# Patient Record
Sex: Female | Born: 1955 | Race: White | Hispanic: No | Marital: Married | Smoking: Never smoker
Health system: Southern US, Community
[De-identification: ages and names within clinical notes are randomized; demographics above are authoritative.]

## PROBLEM LIST (undated history)

## (undated) DIAGNOSIS — E785 Hyperlipidemia, unspecified: Secondary | ICD-10-CM

## (undated) HISTORY — DX: Hyperlipidemia, unspecified: E78.5

## (undated) HISTORY — PX: APPENDECTOMY: SHX54

## (undated) HISTORY — PX: BREAST EXCISIONAL BIOPSY: SUR124

---

## 2000-08-13 ENCOUNTER — Encounter: Admission: RE | Admit: 2000-08-13 | Discharge: 2000-08-13 | Payer: Self-pay | Admitting: *Deleted

## 2000-08-13 ENCOUNTER — Encounter: Payer: Self-pay | Admitting: *Deleted

## 2000-10-20 ENCOUNTER — Other Ambulatory Visit: Admission: RE | Admit: 2000-10-20 | Discharge: 2000-10-20 | Payer: Self-pay | Admitting: Family Medicine

## 2003-05-26 ENCOUNTER — Encounter: Payer: Self-pay | Admitting: *Deleted

## 2003-05-26 ENCOUNTER — Encounter: Admission: RE | Admit: 2003-05-26 | Discharge: 2003-05-26 | Payer: Self-pay | Admitting: *Deleted

## 2003-07-21 ENCOUNTER — Other Ambulatory Visit: Admission: RE | Admit: 2003-07-21 | Discharge: 2003-07-21 | Payer: Self-pay | Admitting: *Deleted

## 2004-01-31 ENCOUNTER — Other Ambulatory Visit: Admission: RE | Admit: 2004-01-31 | Discharge: 2004-01-31 | Payer: Self-pay | Admitting: *Deleted

## 2004-07-24 ENCOUNTER — Other Ambulatory Visit: Admission: RE | Admit: 2004-07-24 | Discharge: 2004-07-24 | Payer: Self-pay | Admitting: Family Medicine

## 2004-09-04 ENCOUNTER — Encounter: Admission: RE | Admit: 2004-09-04 | Discharge: 2004-09-04 | Payer: Self-pay | Admitting: Family Medicine

## 2005-07-25 ENCOUNTER — Other Ambulatory Visit: Admission: RE | Admit: 2005-07-25 | Discharge: 2005-07-25 | Payer: Self-pay | Admitting: Family Medicine

## 2005-09-05 ENCOUNTER — Encounter: Admission: RE | Admit: 2005-09-05 | Discharge: 2005-09-05 | Payer: Self-pay | Admitting: Family Medicine

## 2006-09-08 ENCOUNTER — Encounter: Admission: RE | Admit: 2006-09-08 | Discharge: 2006-09-08 | Payer: Self-pay | Admitting: Family Medicine

## 2006-09-22 ENCOUNTER — Encounter: Admission: RE | Admit: 2006-09-22 | Discharge: 2006-09-22 | Payer: Self-pay | Admitting: Family Medicine

## 2007-01-26 ENCOUNTER — Other Ambulatory Visit: Admission: RE | Admit: 2007-01-26 | Discharge: 2007-01-26 | Payer: Self-pay | Admitting: Family Medicine

## 2007-01-30 ENCOUNTER — Encounter: Admission: RE | Admit: 2007-01-30 | Discharge: 2007-01-30 | Payer: Self-pay | Admitting: Family Medicine

## 2007-09-10 ENCOUNTER — Encounter: Admission: RE | Admit: 2007-09-10 | Discharge: 2007-09-10 | Payer: Self-pay | Admitting: Family Medicine

## 2008-02-23 ENCOUNTER — Other Ambulatory Visit: Admission: RE | Admit: 2008-02-23 | Discharge: 2008-02-23 | Payer: Self-pay | Admitting: Family Medicine

## 2008-09-02 ENCOUNTER — Other Ambulatory Visit: Admission: RE | Admit: 2008-09-02 | Discharge: 2008-09-02 | Payer: Self-pay | Admitting: Family Medicine

## 2008-09-16 ENCOUNTER — Encounter: Admission: RE | Admit: 2008-09-16 | Discharge: 2008-09-16 | Payer: Self-pay | Admitting: Family Medicine

## 2009-03-17 ENCOUNTER — Other Ambulatory Visit: Admission: RE | Admit: 2009-03-17 | Discharge: 2009-03-17 | Payer: Self-pay | Admitting: Family Medicine

## 2009-03-23 ENCOUNTER — Encounter: Admission: RE | Admit: 2009-03-23 | Discharge: 2009-03-23 | Payer: Self-pay | Admitting: Family Medicine

## 2009-10-30 ENCOUNTER — Encounter: Admission: RE | Admit: 2009-10-30 | Discharge: 2009-10-30 | Payer: Self-pay | Admitting: Family Medicine

## 2010-06-13 ENCOUNTER — Other Ambulatory Visit: Admission: RE | Admit: 2010-06-13 | Discharge: 2010-06-13 | Payer: Self-pay | Admitting: Family Medicine

## 2010-09-01 ENCOUNTER — Encounter: Payer: Self-pay | Admitting: Family Medicine

## 2010-09-02 ENCOUNTER — Encounter: Payer: Self-pay | Admitting: Family Medicine

## 2011-06-19 ENCOUNTER — Other Ambulatory Visit (HOSPITAL_COMMUNITY)
Admission: RE | Admit: 2011-06-19 | Discharge: 2011-06-19 | Disposition: A | Discharge status: Home / Self Care | Payer: No Typology Code available for payment source | Source: Ambulatory Visit | Attending: Family Medicine | Admitting: Family Medicine

## 2011-06-19 ENCOUNTER — Other Ambulatory Visit: Payer: Self-pay | Admitting: Family Medicine

## 2011-06-19 DIAGNOSIS — Z124 Encounter for screening for malignant neoplasm of cervix: Secondary | ICD-10-CM | POA: Insufficient documentation

## 2011-06-19 DIAGNOSIS — Z1159 Encounter for screening for other viral diseases: Secondary | ICD-10-CM | POA: Insufficient documentation

## 2011-10-24 ENCOUNTER — Other Ambulatory Visit: Payer: Self-pay | Admitting: Family Medicine

## 2011-10-24 DIAGNOSIS — N938 Other specified abnormal uterine and vaginal bleeding: Secondary | ICD-10-CM

## 2011-10-30 ENCOUNTER — Ambulatory Visit
Admission: RE | Admit: 2011-10-30 | Discharge: 2011-10-30 | Disposition: A | Discharge status: Home / Self Care | Payer: BC Managed Care – PPO | Source: Ambulatory Visit | Attending: Family Medicine | Admitting: Family Medicine

## 2011-10-30 DIAGNOSIS — N938 Other specified abnormal uterine and vaginal bleeding: Secondary | ICD-10-CM

## 2011-10-30 IMAGING — US US PELVIS COMPLETE
1 series · 14 of 25 positions shown · non-contrast
Comparison: None.

CLINICAL DATA: Dysfunctional uterine bleeding.  The patient is 55
years old.

TRANSABDOMINAL AND TRANSVAGINAL ULTRASOUND OF PELVIS
TECHNIQUE: Both transabdominal and transvaginal ultrasound
examinations of the pelvis were performed. Transabdominal technique
was performed for global imaging of the pelvis including uterus,
ovaries, adnexal regions, and pelvic cul-de-sac.

[Series 1: us pelvis complete · 0.20mm/px · 14 of 67 slices shown]
[im 1/67]
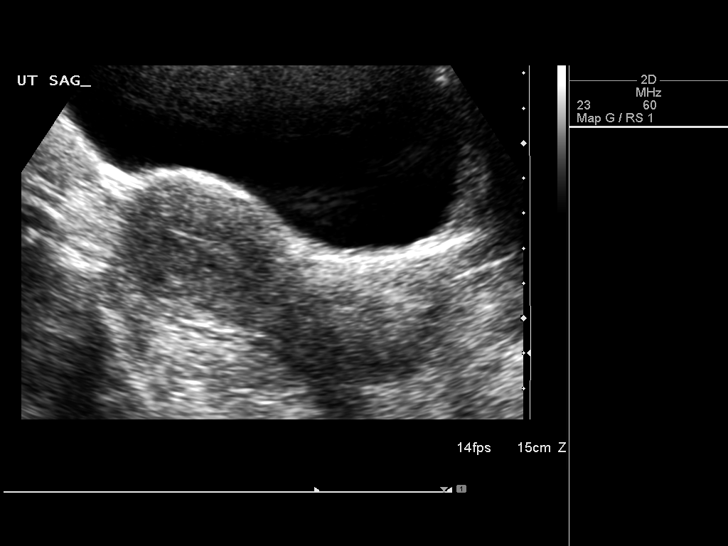
[im 6/67]
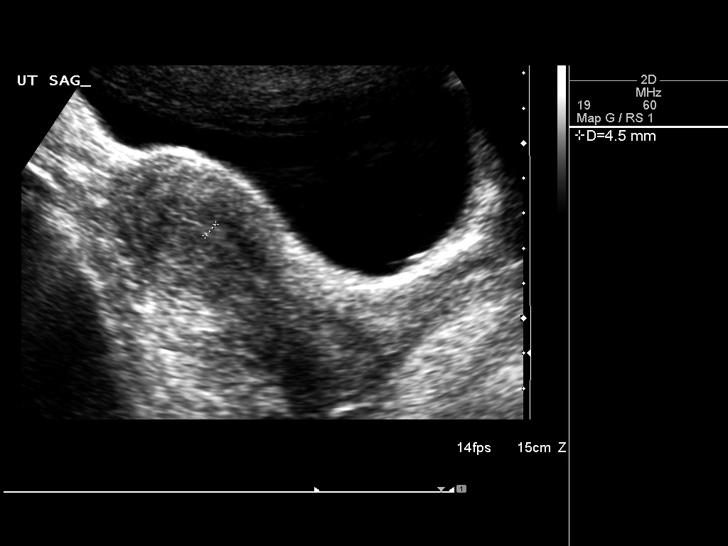
[im 12/67]
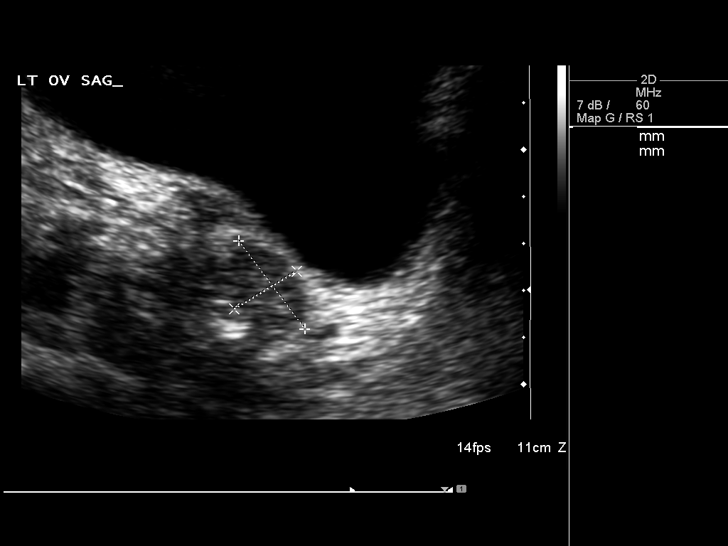
[im 17/67]
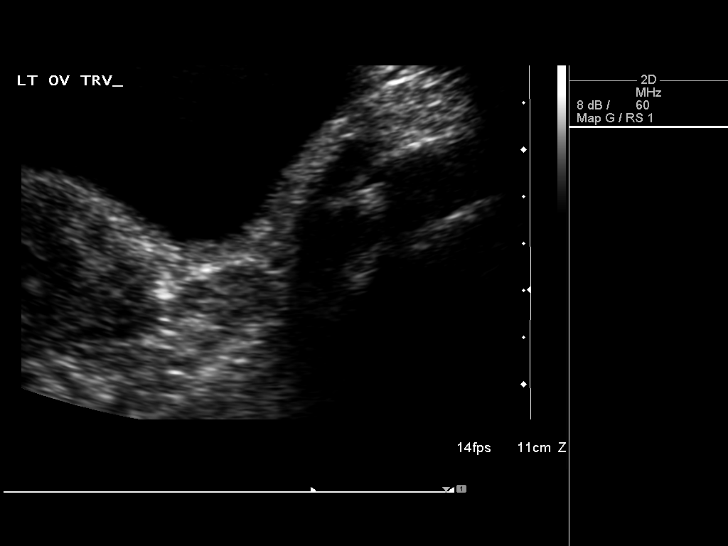
[im 23/67]
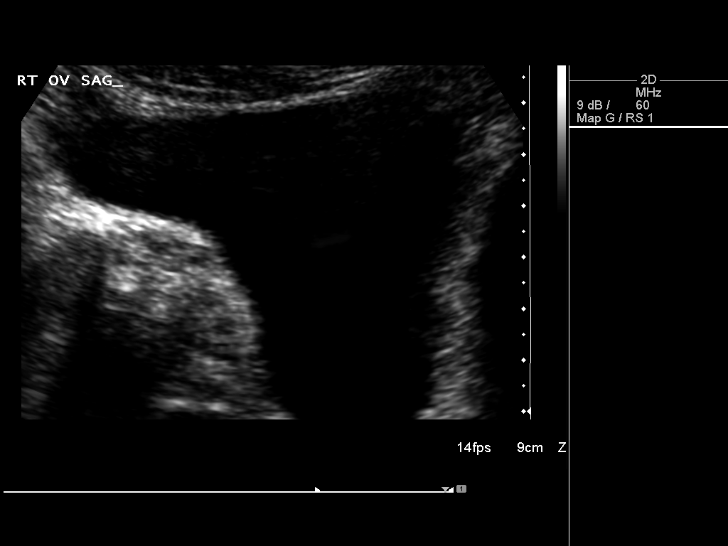
[im 25/67]
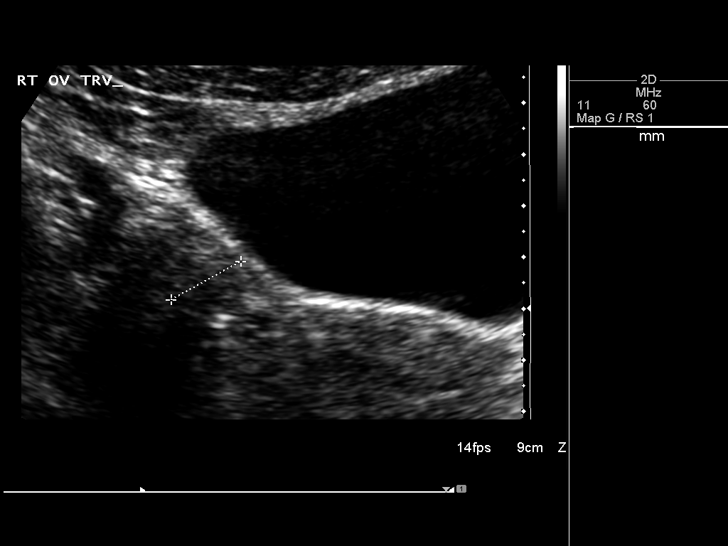
[im 31/67]
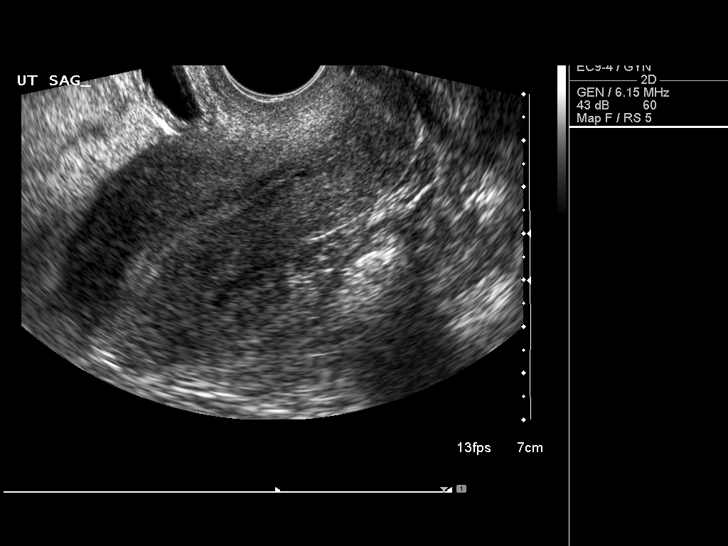
[im 36/67]
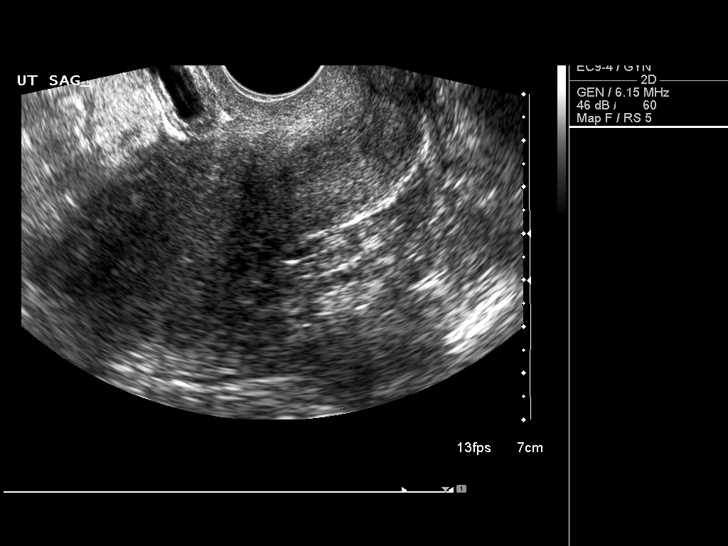
[im 42/67]
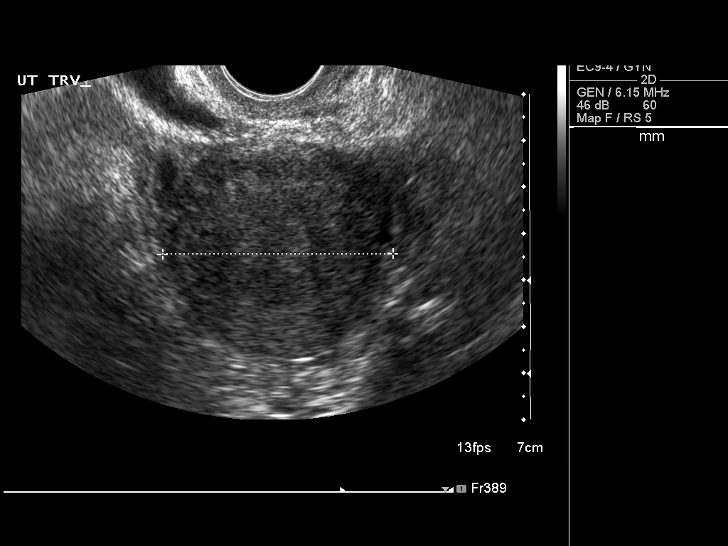
[im 45/67]
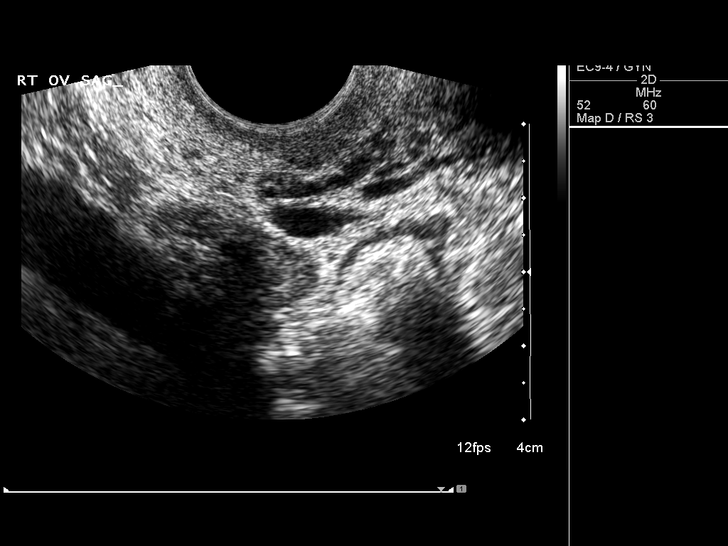
[im 50/67]
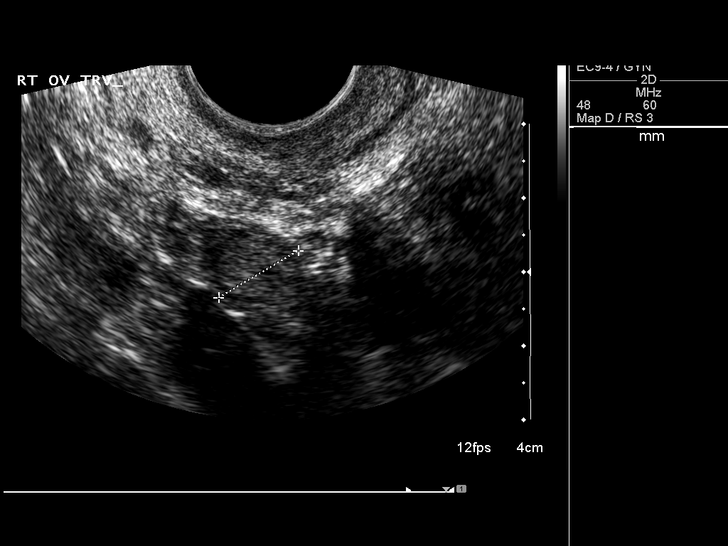
[im 56/67]
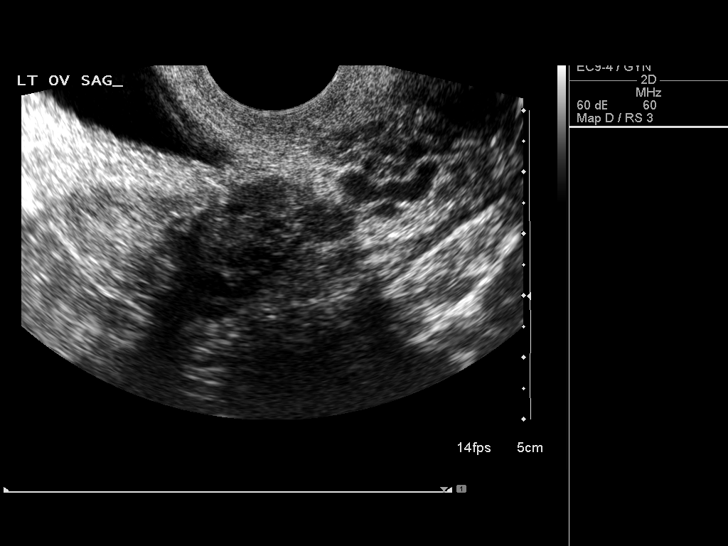
[im 61/67]
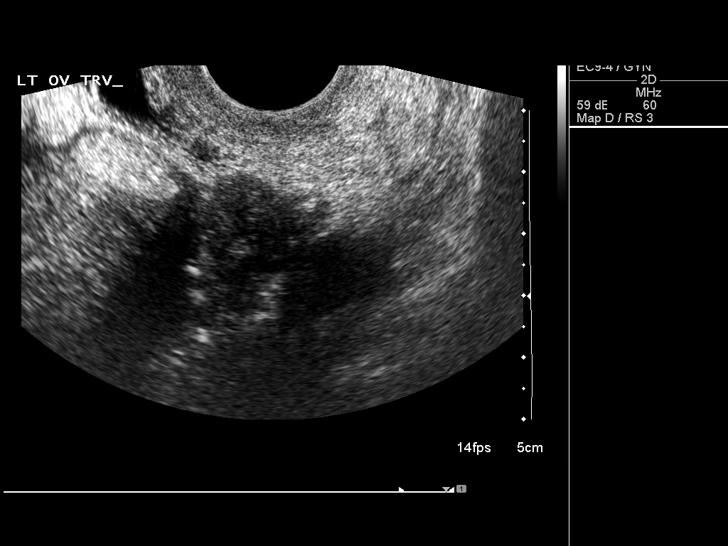
[im 67/67]
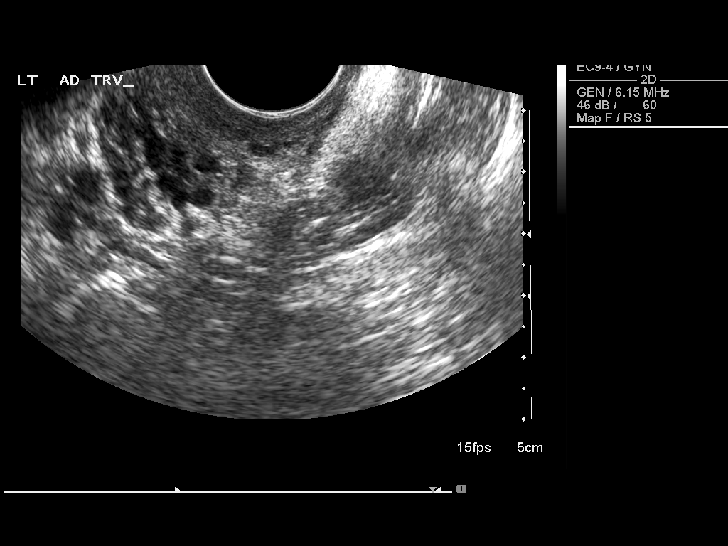

[14 of 25 positions shown; findings below may reference images not displayed]

It was necessary to proceed with endovaginal exam following the
transabdominal exam to visualize the endometrial.
FINDINGS: Uterus: Normal in size}. Measures 8.5 x 4.5 x 5.0 cm.  Myometrium
is heterogeneous, with some scattered linear areas of shadowing.
No focal uterine mass.

Endometrium: Normal in thickness and appearance.  Measures 6.4 mm
in thickness.

Right ovary:  Normal appearance/no adnexal mass.  Measures 2.2 x
1.1 x 1.3 cm.

Left ovary: Normal appearance/no adnexal mass.  Measures 3.0 x
x 1.9 cm.

Other findings: No free fluid
IMPRESSION: 1.  Endometrial thickness is 6.4 mm.
2.  Heterogeneous uterine myometrium. Possible uterine adenomyosis.
3.  Normal ovaries.

## 2012-10-27 ENCOUNTER — Other Ambulatory Visit: Payer: Self-pay | Admitting: Family Medicine

## 2012-10-27 DIAGNOSIS — Z1231 Encounter for screening mammogram for malignant neoplasm of breast: Secondary | ICD-10-CM

## 2012-11-24 ENCOUNTER — Ambulatory Visit
Admission: RE | Admit: 2012-11-24 | Discharge: 2012-11-24 | Disposition: A | Discharge status: Home / Self Care | Payer: BC Managed Care – PPO | Source: Ambulatory Visit | Attending: Family Medicine | Admitting: Family Medicine

## 2012-11-24 DIAGNOSIS — Z1231 Encounter for screening mammogram for malignant neoplasm of breast: Secondary | ICD-10-CM

## 2013-11-25 ENCOUNTER — Other Ambulatory Visit: Payer: Self-pay | Admitting: Family Medicine

## 2013-11-25 ENCOUNTER — Other Ambulatory Visit (HOSPITAL_COMMUNITY)
Admission: RE | Admit: 2013-11-25 | Discharge: 2013-11-25 | Disposition: A | Discharge status: Home / Self Care | Payer: BC Managed Care – PPO | Source: Ambulatory Visit | Attending: Family Medicine | Admitting: Family Medicine

## 2013-11-25 DIAGNOSIS — Z0000 Encounter for general adult medical examination without abnormal findings: Secondary | ICD-10-CM | POA: Insufficient documentation

## 2014-01-06 ENCOUNTER — Other Ambulatory Visit: Payer: Self-pay

## 2014-01-06 DIAGNOSIS — Z1231 Encounter for screening mammogram for malignant neoplasm of breast: Secondary | ICD-10-CM

## 2014-01-19 ENCOUNTER — Ambulatory Visit
Admission: RE | Admit: 2014-01-19 | Discharge: 2014-01-19 | Disposition: A | Discharge status: Home / Self Care | Payer: BC Managed Care – PPO | Source: Ambulatory Visit

## 2014-01-19 DIAGNOSIS — Z1231 Encounter for screening mammogram for malignant neoplasm of breast: Secondary | ICD-10-CM

## 2014-12-26 ENCOUNTER — Other Ambulatory Visit: Payer: Self-pay

## 2014-12-26 DIAGNOSIS — Z1231 Encounter for screening mammogram for malignant neoplasm of breast: Secondary | ICD-10-CM

## 2015-01-23 ENCOUNTER — Ambulatory Visit
Admission: RE | Admit: 2015-01-23 | Discharge: 2015-01-23 | Disposition: A | Discharge status: Home / Self Care | Payer: BLUE CROSS/BLUE SHIELD | Source: Ambulatory Visit

## 2015-01-23 DIAGNOSIS — Z1231 Encounter for screening mammogram for malignant neoplasm of breast: Secondary | ICD-10-CM

## 2015-12-20 ENCOUNTER — Other Ambulatory Visit: Payer: Self-pay

## 2015-12-20 DIAGNOSIS — Z1231 Encounter for screening mammogram for malignant neoplasm of breast: Secondary | ICD-10-CM

## 2016-02-14 ENCOUNTER — Ambulatory Visit: Payer: BLUE CROSS/BLUE SHIELD

## 2016-02-15 ENCOUNTER — Other Ambulatory Visit: Payer: Self-pay | Admitting: Family Medicine

## 2016-02-15 ENCOUNTER — Ambulatory Visit
Admission: RE | Admit: 2016-02-15 | Discharge: 2016-02-15 | Disposition: A | Discharge status: Home / Self Care | Payer: BLUE CROSS/BLUE SHIELD | Source: Ambulatory Visit

## 2016-02-15 DIAGNOSIS — Z1231 Encounter for screening mammogram for malignant neoplasm of breast: Secondary | ICD-10-CM

## 2016-12-31 ENCOUNTER — Other Ambulatory Visit: Payer: Self-pay | Admitting: Family Medicine

## 2016-12-31 ENCOUNTER — Other Ambulatory Visit (HOSPITAL_COMMUNITY)
Admission: RE | Admit: 2016-12-31 | Discharge: 2016-12-31 | Disposition: A | Discharge status: Home / Self Care | Payer: BLUE CROSS/BLUE SHIELD | Source: Ambulatory Visit | Attending: Family Medicine | Admitting: Family Medicine

## 2016-12-31 DIAGNOSIS — Z124 Encounter for screening for malignant neoplasm of cervix: Secondary | ICD-10-CM | POA: Insufficient documentation

## 2017-01-01 LAB — CYTOLOGY - PAP: DIAGNOSIS: NEGATIVE

## 2017-01-08 ENCOUNTER — Other Ambulatory Visit: Payer: Self-pay | Admitting: Family Medicine

## 2017-01-08 DIAGNOSIS — Z1231 Encounter for screening mammogram for malignant neoplasm of breast: Secondary | ICD-10-CM

## 2017-02-17 ENCOUNTER — Ambulatory Visit
Admission: RE | Admit: 2017-02-17 | Discharge: 2017-02-17 | Disposition: A | Discharge status: Home / Self Care | Payer: BLUE CROSS/BLUE SHIELD | Source: Ambulatory Visit | Attending: Family Medicine | Admitting: Family Medicine

## 2017-02-17 DIAGNOSIS — Z1231 Encounter for screening mammogram for malignant neoplasm of breast: Secondary | ICD-10-CM

## 2020-02-21 DIAGNOSIS — R69 Illness, unspecified: Secondary | ICD-10-CM | POA: Diagnosis not present

## 2020-02-25 ENCOUNTER — Other Ambulatory Visit (HOSPITAL_COMMUNITY)
Admission: RE | Admit: 2020-02-25 | Discharge: 2020-02-25 | Disposition: A | Discharge status: Home / Self Care | Payer: Medicare HMO | Source: Ambulatory Visit | Attending: Family Medicine | Admitting: Family Medicine

## 2020-02-25 ENCOUNTER — Other Ambulatory Visit: Payer: Self-pay | Admitting: Family Medicine

## 2020-02-25 DIAGNOSIS — E559 Vitamin D deficiency, unspecified: Secondary | ICD-10-CM | POA: Diagnosis not present

## 2020-02-25 DIAGNOSIS — R69 Illness, unspecified: Secondary | ICD-10-CM | POA: Diagnosis not present

## 2020-02-25 DIAGNOSIS — Z1151 Encounter for screening for human papillomavirus (HPV): Secondary | ICD-10-CM | POA: Insufficient documentation

## 2020-02-25 DIAGNOSIS — Z01419 Encounter for gynecological examination (general) (routine) without abnormal findings: Secondary | ICD-10-CM | POA: Diagnosis not present

## 2020-02-25 DIAGNOSIS — M810 Age-related osteoporosis without current pathological fracture: Secondary | ICD-10-CM

## 2020-02-25 DIAGNOSIS — Z124 Encounter for screening for malignant neoplasm of cervix: Secondary | ICD-10-CM | POA: Diagnosis present

## 2020-02-25 DIAGNOSIS — E782 Mixed hyperlipidemia: Secondary | ICD-10-CM | POA: Diagnosis not present

## 2020-02-25 DIAGNOSIS — Z131 Encounter for screening for diabetes mellitus: Secondary | ICD-10-CM | POA: Diagnosis not present

## 2020-02-25 DIAGNOSIS — Z0000 Encounter for general adult medical examination without abnormal findings: Secondary | ICD-10-CM | POA: Diagnosis not present

## 2020-02-25 DIAGNOSIS — L659 Nonscarring hair loss, unspecified: Secondary | ICD-10-CM | POA: Diagnosis not present

## 2020-02-25 DIAGNOSIS — Z7189 Other specified counseling: Secondary | ICD-10-CM | POA: Diagnosis not present

## 2020-02-25 DIAGNOSIS — D649 Anemia, unspecified: Secondary | ICD-10-CM | POA: Diagnosis not present

## 2020-02-25 DIAGNOSIS — M818 Other osteoporosis without current pathological fracture: Secondary | ICD-10-CM | POA: Diagnosis not present

## 2020-02-28 ENCOUNTER — Other Ambulatory Visit: Payer: Self-pay | Admitting: Family Medicine

## 2020-02-28 DIAGNOSIS — Z1231 Encounter for screening mammogram for malignant neoplasm of breast: Secondary | ICD-10-CM

## 2020-03-01 LAB — CYTOLOGY - PAP
Comment: NEGATIVE
Diagnosis: NEGATIVE
Diagnosis: REACTIVE
High risk HPV: NEGATIVE

## 2020-03-13 DIAGNOSIS — Z1211 Encounter for screening for malignant neoplasm of colon: Secondary | ICD-10-CM | POA: Diagnosis not present

## 2020-03-13 DIAGNOSIS — Z1212 Encounter for screening for malignant neoplasm of rectum: Secondary | ICD-10-CM | POA: Diagnosis not present

## 2020-05-01 DIAGNOSIS — H16143 Punctate keratitis, bilateral: Secondary | ICD-10-CM | POA: Diagnosis not present

## 2020-05-01 DIAGNOSIS — H35372 Puckering of macula, left eye: Secondary | ICD-10-CM | POA: Diagnosis not present

## 2020-05-01 DIAGNOSIS — H43813 Vitreous degeneration, bilateral: Secondary | ICD-10-CM | POA: Diagnosis not present

## 2020-05-01 DIAGNOSIS — H16223 Keratoconjunctivitis sicca, not specified as Sjogren's, bilateral: Secondary | ICD-10-CM | POA: Diagnosis not present

## 2020-05-09 DIAGNOSIS — Z0100 Encounter for examination of eyes and vision without abnormal findings: Secondary | ICD-10-CM | POA: Diagnosis not present

## 2020-05-18 DIAGNOSIS — Z0100 Encounter for examination of eyes and vision without abnormal findings: Secondary | ICD-10-CM | POA: Diagnosis not present

## 2020-05-24 ENCOUNTER — Ambulatory Visit
Admission: RE | Admit: 2020-05-24 | Discharge: 2020-05-24 | Disposition: A | Discharge status: Home / Self Care | Payer: Medicare HMO | Source: Ambulatory Visit | Attending: Family Medicine | Admitting: Family Medicine

## 2020-05-24 ENCOUNTER — Other Ambulatory Visit: Payer: Self-pay

## 2020-05-24 DIAGNOSIS — M810 Age-related osteoporosis without current pathological fracture: Secondary | ICD-10-CM

## 2020-05-24 DIAGNOSIS — Z1231 Encounter for screening mammogram for malignant neoplasm of breast: Secondary | ICD-10-CM

## 2020-05-24 DIAGNOSIS — M8589 Other specified disorders of bone density and structure, multiple sites: Secondary | ICD-10-CM | POA: Diagnosis not present

## 2020-08-22 DIAGNOSIS — R590 Localized enlarged lymph nodes: Secondary | ICD-10-CM | POA: Diagnosis not present

## 2021-01-04 DIAGNOSIS — M75101 Unspecified rotator cuff tear or rupture of right shoulder, not specified as traumatic: Secondary | ICD-10-CM | POA: Diagnosis not present

## 2021-01-10 DIAGNOSIS — M6281 Muscle weakness (generalized): Secondary | ICD-10-CM | POA: Diagnosis not present

## 2021-01-10 DIAGNOSIS — M25512 Pain in left shoulder: Secondary | ICD-10-CM | POA: Diagnosis not present

## 2021-01-10 DIAGNOSIS — M25511 Pain in right shoulder: Secondary | ICD-10-CM | POA: Diagnosis not present

## 2021-01-15 DIAGNOSIS — M25511 Pain in right shoulder: Secondary | ICD-10-CM | POA: Diagnosis not present

## 2021-01-15 DIAGNOSIS — M25512 Pain in left shoulder: Secondary | ICD-10-CM | POA: Diagnosis not present

## 2021-01-15 DIAGNOSIS — M6281 Muscle weakness (generalized): Secondary | ICD-10-CM | POA: Diagnosis not present

## 2021-01-17 DIAGNOSIS — M6281 Muscle weakness (generalized): Secondary | ICD-10-CM | POA: Diagnosis not present

## 2021-01-17 DIAGNOSIS — M25511 Pain in right shoulder: Secondary | ICD-10-CM | POA: Diagnosis not present

## 2021-01-17 DIAGNOSIS — M25512 Pain in left shoulder: Secondary | ICD-10-CM | POA: Diagnosis not present

## 2021-01-22 DIAGNOSIS — M25512 Pain in left shoulder: Secondary | ICD-10-CM | POA: Diagnosis not present

## 2021-01-22 DIAGNOSIS — M25511 Pain in right shoulder: Secondary | ICD-10-CM | POA: Diagnosis not present

## 2021-01-22 DIAGNOSIS — M6281 Muscle weakness (generalized): Secondary | ICD-10-CM | POA: Diagnosis not present

## 2021-01-25 DIAGNOSIS — M25512 Pain in left shoulder: Secondary | ICD-10-CM | POA: Diagnosis not present

## 2021-01-25 DIAGNOSIS — M25511 Pain in right shoulder: Secondary | ICD-10-CM | POA: Diagnosis not present

## 2021-01-25 DIAGNOSIS — M6281 Muscle weakness (generalized): Secondary | ICD-10-CM | POA: Diagnosis not present

## 2021-01-30 DIAGNOSIS — M25511 Pain in right shoulder: Secondary | ICD-10-CM | POA: Diagnosis not present

## 2021-01-30 DIAGNOSIS — M25512 Pain in left shoulder: Secondary | ICD-10-CM | POA: Diagnosis not present

## 2021-01-30 DIAGNOSIS — M6281 Muscle weakness (generalized): Secondary | ICD-10-CM | POA: Diagnosis not present

## 2021-03-02 DIAGNOSIS — M25511 Pain in right shoulder: Secondary | ICD-10-CM | POA: Diagnosis not present

## 2021-03-02 DIAGNOSIS — M25512 Pain in left shoulder: Secondary | ICD-10-CM | POA: Diagnosis not present

## 2021-03-02 DIAGNOSIS — M6281 Muscle weakness (generalized): Secondary | ICD-10-CM | POA: Diagnosis not present

## 2021-03-05 DIAGNOSIS — M818 Other osteoporosis without current pathological fracture: Secondary | ICD-10-CM | POA: Diagnosis not present

## 2021-03-05 DIAGNOSIS — E559 Vitamin D deficiency, unspecified: Secondary | ICD-10-CM | POA: Diagnosis not present

## 2021-03-05 DIAGNOSIS — E782 Mixed hyperlipidemia: Secondary | ICD-10-CM | POA: Diagnosis not present

## 2021-03-05 DIAGNOSIS — D649 Anemia, unspecified: Secondary | ICD-10-CM | POA: Diagnosis not present

## 2021-03-05 DIAGNOSIS — Z124 Encounter for screening for malignant neoplasm of cervix: Secondary | ICD-10-CM | POA: Diagnosis not present

## 2021-03-05 DIAGNOSIS — Z131 Encounter for screening for diabetes mellitus: Secondary | ICD-10-CM | POA: Diagnosis not present

## 2021-03-05 DIAGNOSIS — Z7189 Other specified counseling: Secondary | ICD-10-CM | POA: Diagnosis not present

## 2021-03-05 DIAGNOSIS — L659 Nonscarring hair loss, unspecified: Secondary | ICD-10-CM | POA: Diagnosis not present

## 2021-03-06 DIAGNOSIS — Z1389 Encounter for screening for other disorder: Secondary | ICD-10-CM | POA: Diagnosis not present

## 2021-03-06 DIAGNOSIS — E782 Mixed hyperlipidemia: Secondary | ICD-10-CM | POA: Diagnosis not present

## 2021-03-06 DIAGNOSIS — Z0000 Encounter for general adult medical examination without abnormal findings: Secondary | ICD-10-CM | POA: Diagnosis not present

## 2021-03-06 DIAGNOSIS — M818 Other osteoporosis without current pathological fracture: Secondary | ICD-10-CM | POA: Diagnosis not present

## 2021-03-06 DIAGNOSIS — Z131 Encounter for screening for diabetes mellitus: Secondary | ICD-10-CM | POA: Diagnosis not present

## 2021-05-04 DIAGNOSIS — H52223 Regular astigmatism, bilateral: Secondary | ICD-10-CM | POA: Diagnosis not present

## 2021-05-04 DIAGNOSIS — H16223 Keratoconjunctivitis sicca, not specified as Sjogren's, bilateral: Secondary | ICD-10-CM | POA: Diagnosis not present

## 2021-05-04 DIAGNOSIS — H35372 Puckering of macula, left eye: Secondary | ICD-10-CM | POA: Diagnosis not present

## 2021-05-04 DIAGNOSIS — H2511 Age-related nuclear cataract, right eye: Secondary | ICD-10-CM | POA: Diagnosis not present

## 2021-05-04 DIAGNOSIS — H524 Presbyopia: Secondary | ICD-10-CM | POA: Diagnosis not present

## 2021-05-04 DIAGNOSIS — H5203 Hypermetropia, bilateral: Secondary | ICD-10-CM | POA: Diagnosis not present

## 2021-05-11 DIAGNOSIS — Z0100 Encounter for examination of eyes and vision without abnormal findings: Secondary | ICD-10-CM | POA: Diagnosis not present

## 2022-01-03 DIAGNOSIS — M62838 Other muscle spasm: Secondary | ICD-10-CM | POA: Diagnosis not present

## 2022-03-07 DIAGNOSIS — M818 Other osteoporosis without current pathological fracture: Secondary | ICD-10-CM | POA: Diagnosis not present

## 2022-03-07 DIAGNOSIS — E782 Mixed hyperlipidemia: Secondary | ICD-10-CM | POA: Diagnosis not present

## 2022-03-11 DIAGNOSIS — M722 Plantar fascial fibromatosis: Secondary | ICD-10-CM | POA: Diagnosis not present

## 2022-03-11 DIAGNOSIS — Z0000 Encounter for general adult medical examination without abnormal findings: Secondary | ICD-10-CM | POA: Diagnosis not present

## 2022-03-11 DIAGNOSIS — Z01419 Encounter for gynecological examination (general) (routine) without abnormal findings: Secondary | ICD-10-CM | POA: Diagnosis not present

## 2022-03-11 DIAGNOSIS — R079 Chest pain, unspecified: Secondary | ICD-10-CM | POA: Diagnosis not present

## 2022-03-11 DIAGNOSIS — M818 Other osteoporosis without current pathological fracture: Secondary | ICD-10-CM | POA: Diagnosis not present

## 2022-03-11 DIAGNOSIS — R002 Palpitations: Secondary | ICD-10-CM | POA: Diagnosis not present

## 2022-03-11 DIAGNOSIS — E782 Mixed hyperlipidemia: Secondary | ICD-10-CM | POA: Diagnosis not present

## 2022-03-11 DIAGNOSIS — Z1331 Encounter for screening for depression: Secondary | ICD-10-CM | POA: Diagnosis not present

## 2022-03-18 ENCOUNTER — Other Ambulatory Visit: Payer: Self-pay | Admitting: Family Medicine

## 2022-03-18 DIAGNOSIS — M810 Age-related osteoporosis without current pathological fracture: Secondary | ICD-10-CM

## 2022-04-03 ENCOUNTER — Encounter: Payer: Self-pay | Admitting: Cardiology

## 2022-04-03 ENCOUNTER — Ambulatory Visit: Payer: Medicare HMO | Admitting: Cardiology

## 2022-04-03 VITALS — BP 132/82 | HR 71 | Temp 97.3°F | Resp 16 | Ht 63.0 in | Wt 142.0 lb

## 2022-04-03 DIAGNOSIS — R072 Precordial pain: Secondary | ICD-10-CM

## 2022-04-03 DIAGNOSIS — E7800 Pure hypercholesterolemia, unspecified: Secondary | ICD-10-CM

## 2022-04-03 NOTE — Progress Notes (Signed)
Primary Physician/Referring:  Gweneth Dimitri, MD  Patient ID: Madison Horn, female    DOB: [birth year], 66 y.o.   MRN: [id number]  Chief Complaint  Patient presents with   Chest Pain   New Patient (Initial Visit)    Referred by Gweneth Dimitri, MD   HPI:    Madison Horn  is a 66 y.o. Caucasian female patient with hyperlipidemia, otherwise no other cardiovascular risks, referred to me for evaluation of chest pain that started about 3 to 4 months ago.  Chest pain described as sharp pain, heaviness or pressure-like sensation in the middle of the chest, not necessarily related to exertion activity, lasting only a few seconds.  About 2 weeks ago after she completed her exercise.  She had chest tightness that again lasted for less than a minute.  In view of recurrence of chest pain, she presents for further evaluation.  She is accompanied by her husband.  Denies dyspnea, no PND or orthopnea.  Denies symptoms of claudication.  Past Medical History:  Diagnosis Date   Hyperlipidemia    Past Surgical History:  Procedure Laterality Date   APPENDECTOMY Right    BREAST EXCISIONAL BIOPSY Right    benign   Family History  Problem Relation Age of Onset   Dementia Father    Dementia Paternal Aunt    Heart attack Paternal Grandfather     Social History   Tobacco Use   Smoking status: Never   Smokeless tobacco: Never  Substance Use Topics   Alcohol use: Not Currently   Marital Status: Married  ROS  Review of Systems  Cardiovascular:  Positive for chest pain. Negative for dyspnea on exertion and leg swelling.   Objective      04/03/2022   11:35 AM 04/03/2022   11:22 AM  Vitals with BMI  Height  5\' 3"   Weight  142 lbs  BMI  25.16  Systolic 132 139  Diastolic 82 79  Pulse 71 79   Today's Vitals   04/03/22 1122 04/03/22 1135  BP: 139/79 132/82  Pulse: 79 71  Resp: 16   Temp: (!) 97.3 F (36.3 C)   TempSrc: Temporal   Weight: 142 lb (64.4 kg)   Height: 5\' 3"  (1.6 m)     Body mass index is 25.15 kg/m.  Physical Exam Neck:     Vascular: No carotid bruit or JVD.  Cardiovascular:     Rate and Rhythm: Normal rate and regular rhythm.     Pulses: Intact distal pulses.     Heart sounds: Normal heart sounds. No murmur heard.    No gallop.  Pulmonary:     Effort: Pulmonary effort is normal.     Breath sounds: Normal breath sounds.  Abdominal:     General: Bowel sounds are normal.     Palpations: Abdomen is soft.  Musculoskeletal:     Right lower leg: No edema.     Left lower leg: No edema.     Medications and allergies  No Known Allergies   Medication list after today's encounter   Current Outpatient Medications:    Multiple Vitamin (MULTIVITAMIN) capsule, Take 1 capsule by mouth daily., Disp: , Rfl:    rosuvastatin (CRESTOR) 10 MG tablet, Take 1 tablet by mouth daily. (Patient not taking: Reported on 04/03/2022), Disp: , Rfl:   Laboratory examination:   External labs:   Labs 03/07/2022:  BUN 29, creatinine 0.97, EGFR 77 mL, potassium 5.1.  LFTs normal.  Vitamin D 32.8.  Total cholesterol 231, triglycerides 128, HDL 49, LDL 159.  Non-HDL cholesterol 182.  Labs 02/25/2020:  Hb 13.2/HCT 38.9, platelets 310.  Radiology:    Cardiac Studies:   NA  EKG:   EKG 04/03/2022: Normal sinus rhythm at rate of 70 bpm, normal axis.  Incomplete right ischemia, normal EKG    Assessment     ICD-10-CM   1. Precordial chest pain  R07.2 EKG 12-Lead    CT CARDIAC SCORING ([facility] LOCATIONS ONLY)    PCV CARDIAC STRESS TEST    2. Pure hypercholesterolemia  E78.00 CT CARDIAC SCORING ([facility] LOCATIONS ONLY)       Orders Placed This Encounter  Procedures   CT CARDIAC SCORING ([facility] LOCATIONS ONLY)    Standing Status:   Future    Standing Expiration Date:   06/03/2022    Order Specific Question:   Preferred imaging location?    Answer:   [facility]   PCV CARDIAC STRESS TEST    Standing Status:   Future    Standing Expiration Date:   06/03/2022   EKG  12-Lead    No orders of the defined types were placed in this encounter.   There are no discontinued medications.   Recommendations:   Madison Horn is a 66 y.o.  Patient with hyperlipidemia but otherwise no other significant cardiovascular risk factors, referred to me for evaluation of chest pain that started about 3 to 4 months ago.  Chest pain is very atypical, most lasting few seconds and not Related to exertion activity except about 2 to 3 weeks ago she had 1 episode lasting briefly while she was exercising.  Physical examination is unremarkable.  I reviewed her external labs.  As she has no other significant risk factors including hypertension, no family history of heart disease, not a diabetic and she does not smoke, I would like to further restratify her to see whether she needs to be on a statin therapy.  Presently she is not taking Crestor that was prescribed by her PCP and she is reluctant to starting the medication.  We will order his coronary calcium score, I will also perform a routine treadmill exercise stress test.  I would like to see him back in 6 weeks for follow-up and I will make further recommendations.    Adrian Prows, MD, [facility] 04/03/2022, 12:14 PM Office: [phone]

## 2022-04-05 ENCOUNTER — Ambulatory Visit: Payer: Medicare HMO

## 2022-04-05 DIAGNOSIS — R072 Precordial pain: Secondary | ICD-10-CM | POA: Diagnosis not present

## 2022-04-07 NOTE — Progress Notes (Signed)
Normal stress test

## 2022-04-08 NOTE — Progress Notes (Signed)
Called pt no answer, left a vm to return the call.

## 2022-04-09 NOTE — Progress Notes (Signed)
Patient called back, I have discussed results with her.

## 2022-04-09 NOTE — Progress Notes (Signed)
2nd attempt : Called patient, NA, LMAM

## 2022-04-17 ENCOUNTER — Ambulatory Visit
Admission: RE | Admit: 2022-04-17 | Discharge: 2022-04-17 | Disposition: A | Discharge status: Home / Self Care | Payer: No Typology Code available for payment source | Source: Ambulatory Visit | Attending: Cardiology | Admitting: Cardiology

## 2022-04-17 DIAGNOSIS — R072 Precordial pain: Secondary | ICD-10-CM

## 2022-04-17 DIAGNOSIS — E7800 Pure hypercholesterolemia, unspecified: Secondary | ICD-10-CM

## 2022-04-18 NOTE — Progress Notes (Signed)
Coronary calcium score 04/18/2022: Chronic calcium score of 0.  Ascending and descending thoracic aortic measurements are normal.  There is mild aortic atherosclerosis within the arch.  No significant extracardiac abnormality.

## 2022-04-18 NOTE — Progress Notes (Signed)
Let her know I will discuss more on her visit soon, Very low risk study

## 2022-04-24 NOTE — Progress Notes (Signed)
Tried calling patient no answer left a vm

## 2022-04-24 NOTE — Progress Notes (Signed)
Patient called back and is aware of results.

## 2022-05-03 DIAGNOSIS — R32 Unspecified urinary incontinence: Secondary | ICD-10-CM | POA: Diagnosis not present

## 2022-05-06 DIAGNOSIS — H43813 Vitreous degeneration, bilateral: Secondary | ICD-10-CM | POA: Diagnosis not present

## 2022-05-06 DIAGNOSIS — H16223 Keratoconjunctivitis sicca, not specified as Sjogren's, bilateral: Secondary | ICD-10-CM | POA: Diagnosis not present

## 2022-05-06 DIAGNOSIS — H35372 Puckering of macula, left eye: Secondary | ICD-10-CM | POA: Diagnosis not present

## 2022-05-07 ENCOUNTER — Other Ambulatory Visit: Payer: Self-pay | Admitting: Family Medicine

## 2022-05-07 DIAGNOSIS — Z1231 Encounter for screening mammogram for malignant neoplasm of breast: Secondary | ICD-10-CM

## 2022-05-13 ENCOUNTER — Ambulatory Visit: Payer: Self-pay

## 2022-05-14 ENCOUNTER — Ambulatory Visit
Admission: RE | Admit: 2022-05-14 | Discharge: 2022-05-14 | Disposition: A | Discharge status: Home / Self Care | Payer: Medicare HMO | Source: Ambulatory Visit | Attending: Family Medicine | Admitting: Family Medicine

## 2022-05-14 DIAGNOSIS — Z1231 Encounter for screening mammogram for malignant neoplasm of breast: Secondary | ICD-10-CM | POA: Diagnosis not present

## 2022-05-17 ENCOUNTER — Ambulatory Visit: Payer: Medicare HMO | Admitting: Cardiology

## 2022-05-21 ENCOUNTER — Ambulatory Visit: Payer: Medicare HMO | Admitting: Cardiology

## 2022-05-21 ENCOUNTER — Encounter: Payer: Self-pay | Admitting: Cardiology

## 2022-05-21 VITALS — BP 130/78 | HR 70 | Temp 97.6°F | Resp 16 | Ht 63.0 in | Wt 137.2 lb

## 2022-05-21 DIAGNOSIS — R072 Precordial pain: Secondary | ICD-10-CM

## 2022-05-21 DIAGNOSIS — E7800 Pure hypercholesterolemia, unspecified: Secondary | ICD-10-CM

## 2022-05-21 NOTE — Progress Notes (Signed)
Primary Physician/Referring:  Cari Caraway, MD  Patient ID: Madison Horn, female    DOB: November 09, 1955, 66 y.o.   MRN: [id number]  No chief complaint on file.  HPI:    Madison Horn  is a 66 y.o. Patient with hyperlipidemia but otherwise no other significant cardiovascular risk factors, referred to me for evaluation of chest pain.  She was seen by me 6 weeks ago underwent coronary calcium score and routine treadmill exercise stress test and presents here for follow-up.  She was found to have hyperlipidemia and was started on statin therapy by her PCP however patient has not started the medication and wanted to be further evaluated.  She remains asymptomatic.  Past Medical History:  Diagnosis Date   Hyperlipidemia    Past Surgical History:  Procedure Laterality Date   APPENDECTOMY Right    BREAST EXCISIONAL BIOPSY Right    benign   Family History  Problem Relation Age of Onset   Dementia Father    Dementia Paternal Aunt    Heart attack Paternal Grandfather     Social History   Tobacco Use   Smoking status: Never   Smokeless tobacco: Never  Substance Use Topics   Alcohol use: Not Currently   Marital Status: Married  ROS  Review of Systems  Cardiovascular:  Negative for chest pain, dyspnea on exertion and leg swelling.   Objective      05/21/2022    3:31 PM 04/03/2022   11:35 AM 04/03/2022   11:22 AM  Vitals with BMI  Height 5' 3"  5' 3"  Weight 137 lbs 3 oz  142 lbs  BMI 03.52  48.18  Systolic 590 931 121  Diastolic 78 82 79  Pulse 70 71 79   Today's Vitals   05/21/22 1531  BP: 130/78  Pulse: 70  Resp: 16  Temp: 97.6 F (36.4 C)  TempSrc: Temporal  SpO2: 100%  Weight: 137 lb 3.2 oz (62.2 kg)  Height: 5' 3" (1.6 m)   Body mass index is 24.3 kg/m.  Physical Exam Neck:     Vascular: No carotid bruit or JVD.  Cardiovascular:     Rate and Rhythm: Normal rate and regular rhythm.     Pulses: Intact distal pulses.     Heart sounds: Normal heart  sounds. No murmur heard.    No gallop.  Pulmonary:     Effort: Pulmonary effort is normal.     Breath sounds: Normal breath sounds.  Abdominal:     General: Bowel sounds are normal.     Palpations: Abdomen is soft.  Musculoskeletal:     Right lower leg: No edema.     Left lower leg: No edema.    Medications and allergies  No Known Allergies   Medication list    Current Outpatient Medications:    Multiple Vitamin (MULTIVITAMIN) capsule, Take 1 capsule by mouth daily. (Patient not taking: Reported on 05/21/2022), Disp: , Rfl:    rosuvastatin (CRESTOR) 10 MG tablet, Take 1 tablet by mouth daily. (Patient not taking: Reported on 04/03/2022), Disp: , Rfl:   Laboratory examination:   External labs:   Labs 03/07/2022:  BUN 29, creatinine 0.97, EGFR 77 mL, potassium 5.1.  LFTs normal.  Vitamin D 32.8.  Total cholesterol 231, triglycerides 128, HDL 49, LDL 159.  Non-HDL cholesterol 182.  Labs 02/25/2020:  Hb 13.2/HCT 38.9, platelets 310.  Radiology:   Coronary calcium score 04/17/2022: Coronary calcium score of 0.  Ascending and descending thoracic aortic  measurements are normal.  Aortic atherosclerosis within the arch noted. No significant extracardiac abnormality.  Cardiac Studies:   PCV CARDIAC STRESS TEST 04/05/2022  Narrative Exercise treadmill stress test 04/05/2022: Exercise treadmill stress test performed using Bruce protocol.  Patient reached 7 METS, and 87% of age predicted maximum heart rate.  Exercise capacity was fair.  No chest pain reported.  Normal heart rate and hemodynamic response. Stress EKG revealed no ischemic changes. Low risk study.   EKG:   EKG 04/03/2022: Normal sinus rhythm at rate of 70 bpm, normal axis.  Incomplete right ischemia, normal EKG    Assessment     ICD-10-CM   1. Precordial chest pain  R07.2     2. Pure hypercholesterolemia  E78.00        No orders of the defined types were placed in this encounter.   No orders of the  defined types were placed in this encounter.   There are no discontinued medications.   Recommendations:   Madison Horn is a 66 y.o.  Patient with hyperlipidemia but otherwise no other significant cardiovascular risk factors, referred to me for evaluation of chest pain.  She was seen by me 6 weeks ago underwent coronary calcium score and routine treadmill exercise stress test and presents here for follow-up.  She has resumed all activities, [facility]  She remains asymptomatic.    I reviewed the results of the coronary calcium score.  She has a very mild aortic atherosclerosis but no significant calcification noted.  Hence overall cardiovascular risk is low.  At this point given normal stress test and patient wishes, absence of traditional cardiovascular risk that includes hypertension, diabetes mellitus, tobacco use or family history, I will limit to the patient to decide whether she wants to be on a statin or not.  After long discussion, patient prefers to be not on a statin.  I discussed with her regarding reducing her fat intake to <7%, avoiding red meat and also pork and fried food.  She will also avoid excess cheese.  Metamucil at night, adding flaxseed flakes with her meal would also help reduce LDL.  I will see her back on a as needed basis.  Husband is present and all questions answered.   Adrian Prows, MD, [facility] 05/21/2022, 3:49 PM Office: [phone]

## 2022-09-18 DIAGNOSIS — Z0100 Encounter for examination of eyes and vision without abnormal findings: Secondary | ICD-10-CM | POA: Diagnosis not present

## 2022-10-15 ENCOUNTER — Ambulatory Visit
Admission: RE | Admit: 2022-10-15 | Discharge: 2022-10-15 | Disposition: A | Discharge status: Home / Self Care | Payer: Medicare HMO | Source: Ambulatory Visit | Attending: Family Medicine | Admitting: Family Medicine

## 2022-10-15 DIAGNOSIS — M8589 Other specified disorders of bone density and structure, multiple sites: Secondary | ICD-10-CM | POA: Diagnosis not present

## 2022-10-15 DIAGNOSIS — M810 Age-related osteoporosis without current pathological fracture: Secondary | ICD-10-CM

## 2023-03-12 DIAGNOSIS — E782 Mixed hyperlipidemia: Secondary | ICD-10-CM | POA: Diagnosis not present

## 2023-03-12 DIAGNOSIS — M818 Other osteoporosis without current pathological fracture: Secondary | ICD-10-CM | POA: Diagnosis not present

## 2023-03-17 DIAGNOSIS — Z1211 Encounter for screening for malignant neoplasm of colon: Secondary | ICD-10-CM | POA: Diagnosis not present

## 2023-03-17 DIAGNOSIS — I700 Atherosclerosis of aorta: Secondary | ICD-10-CM | POA: Diagnosis not present

## 2023-03-17 DIAGNOSIS — Z6824 Body mass index (BMI) 24.0-24.9, adult: Secondary | ICD-10-CM | POA: Diagnosis not present

## 2023-03-17 DIAGNOSIS — Z0000 Encounter for general adult medical examination without abnormal findings: Secondary | ICD-10-CM | POA: Diagnosis not present

## 2023-03-17 DIAGNOSIS — L578 Other skin changes due to chronic exposure to nonionizing radiation: Secondary | ICD-10-CM | POA: Diagnosis not present

## 2023-03-17 DIAGNOSIS — E782 Mixed hyperlipidemia: Secondary | ICD-10-CM | POA: Diagnosis not present

## 2023-03-17 DIAGNOSIS — M8588 Other specified disorders of bone density and structure, other site: Secondary | ICD-10-CM | POA: Diagnosis not present

## 2023-03-17 DIAGNOSIS — Z1331 Encounter for screening for depression: Secondary | ICD-10-CM | POA: Diagnosis not present

## 2023-03-17 DIAGNOSIS — M818 Other osteoporosis without current pathological fracture: Secondary | ICD-10-CM | POA: Diagnosis not present

## 2023-03-18 DIAGNOSIS — M8580 Other specified disorders of bone density and structure, unspecified site: Secondary | ICD-10-CM | POA: Diagnosis not present

## 2023-03-18 DIAGNOSIS — Z8249 Family history of ischemic heart disease and other diseases of the circulatory system: Secondary | ICD-10-CM | POA: Diagnosis not present

## 2023-03-18 DIAGNOSIS — R32 Unspecified urinary incontinence: Secondary | ICD-10-CM | POA: Diagnosis not present

## 2023-03-18 DIAGNOSIS — Z818 Family history of other mental and behavioral disorders: Secondary | ICD-10-CM | POA: Diagnosis not present

## 2023-03-18 DIAGNOSIS — E785 Hyperlipidemia, unspecified: Secondary | ICD-10-CM | POA: Diagnosis not present

## 2023-03-18 DIAGNOSIS — Z008 Encounter for other general examination: Secondary | ICD-10-CM | POA: Diagnosis not present

## 2023-05-27 DIAGNOSIS — M25512 Pain in left shoulder: Secondary | ICD-10-CM | POA: Diagnosis not present

## 2023-05-27 DIAGNOSIS — M19012 Primary osteoarthritis, left shoulder: Secondary | ICD-10-CM | POA: Diagnosis not present

## 2023-05-27 DIAGNOSIS — Z6825 Body mass index (BMI) 25.0-25.9, adult: Secondary | ICD-10-CM | POA: Diagnosis not present

## 2023-06-02 DIAGNOSIS — D123 Benign neoplasm of transverse colon: Secondary | ICD-10-CM | POA: Diagnosis not present

## 2023-06-02 DIAGNOSIS — K648 Other hemorrhoids: Secondary | ICD-10-CM | POA: Diagnosis not present

## 2023-06-02 DIAGNOSIS — Z1211 Encounter for screening for malignant neoplasm of colon: Secondary | ICD-10-CM | POA: Diagnosis not present

## 2023-06-04 DIAGNOSIS — D123 Benign neoplasm of transverse colon: Secondary | ICD-10-CM | POA: Diagnosis not present

## 2023-06-05 DIAGNOSIS — M7542 Impingement syndrome of left shoulder: Secondary | ICD-10-CM | POA: Diagnosis not present

## 2023-06-10 DIAGNOSIS — L570 Actinic keratosis: Secondary | ICD-10-CM | POA: Diagnosis not present

## 2023-06-10 DIAGNOSIS — L821 Other seborrheic keratosis: Secondary | ICD-10-CM | POA: Diagnosis not present

## 2023-07-07 DIAGNOSIS — L821 Other seborrheic keratosis: Secondary | ICD-10-CM | POA: Diagnosis not present

## 2023-07-07 DIAGNOSIS — L570 Actinic keratosis: Secondary | ICD-10-CM | POA: Diagnosis not present

## 2023-07-07 DIAGNOSIS — L814 Other melanin hyperpigmentation: Secondary | ICD-10-CM | POA: Diagnosis not present

## 2023-07-07 DIAGNOSIS — D1801 Hemangioma of skin and subcutaneous tissue: Secondary | ICD-10-CM | POA: Diagnosis not present

## 2023-07-31 DIAGNOSIS — H35372 Puckering of macula, left eye: Secondary | ICD-10-CM | POA: Diagnosis not present

## 2023-07-31 DIAGNOSIS — H16223 Keratoconjunctivitis sicca, not specified as Sjogren's, bilateral: Secondary | ICD-10-CM | POA: Diagnosis not present

## 2023-07-31 DIAGNOSIS — H43813 Vitreous degeneration, bilateral: Secondary | ICD-10-CM | POA: Diagnosis not present

## 2023-07-31 DIAGNOSIS — H2513 Age-related nuclear cataract, bilateral: Secondary | ICD-10-CM | POA: Diagnosis not present

## 2023-08-15 DIAGNOSIS — H524 Presbyopia: Secondary | ICD-10-CM | POA: Diagnosis not present

## 2023-08-15 DIAGNOSIS — H52223 Regular astigmatism, bilateral: Secondary | ICD-10-CM | POA: Diagnosis not present

## 2023-10-16 DIAGNOSIS — M79645 Pain in left finger(s): Secondary | ICD-10-CM | POA: Diagnosis not present

## 2023-10-16 DIAGNOSIS — M79644 Pain in right finger(s): Secondary | ICD-10-CM | POA: Diagnosis not present

## 2023-10-16 DIAGNOSIS — Z6825 Body mass index (BMI) 25.0-25.9, adult: Secondary | ICD-10-CM | POA: Diagnosis not present

## 2023-10-20 DIAGNOSIS — M79645 Pain in left finger(s): Secondary | ICD-10-CM | POA: Diagnosis not present

## 2023-10-20 DIAGNOSIS — M79644 Pain in right finger(s): Secondary | ICD-10-CM | POA: Diagnosis not present

## 2024-02-09 DIAGNOSIS — M818 Other osteoporosis without current pathological fracture: Secondary | ICD-10-CM | POA: Diagnosis not present

## 2024-02-09 DIAGNOSIS — E782 Mixed hyperlipidemia: Secondary | ICD-10-CM | POA: Diagnosis not present

## 2024-03-11 DIAGNOSIS — E782 Mixed hyperlipidemia: Secondary | ICD-10-CM | POA: Diagnosis not present

## 2024-03-11 DIAGNOSIS — M818 Other osteoporosis without current pathological fracture: Secondary | ICD-10-CM | POA: Diagnosis not present

## 2024-03-25 DIAGNOSIS — M818 Other osteoporosis without current pathological fracture: Secondary | ICD-10-CM | POA: Diagnosis not present

## 2024-03-25 DIAGNOSIS — E782 Mixed hyperlipidemia: Secondary | ICD-10-CM | POA: Diagnosis not present

## 2024-04-11 DIAGNOSIS — M818 Other osteoporosis without current pathological fracture: Secondary | ICD-10-CM | POA: Diagnosis not present

## 2024-04-11 DIAGNOSIS — E782 Mixed hyperlipidemia: Secondary | ICD-10-CM | POA: Diagnosis not present

## 2024-05-11 DIAGNOSIS — M818 Other osteoporosis without current pathological fracture: Secondary | ICD-10-CM | POA: Diagnosis not present

## 2024-05-11 DIAGNOSIS — E782 Mixed hyperlipidemia: Secondary | ICD-10-CM | POA: Diagnosis not present

## 2024-05-21 DIAGNOSIS — Z1231 Encounter for screening mammogram for malignant neoplasm of breast: Secondary | ICD-10-CM | POA: Diagnosis not present

## 2024-06-11 DIAGNOSIS — E782 Mixed hyperlipidemia: Secondary | ICD-10-CM | POA: Diagnosis not present

## 2024-06-11 DIAGNOSIS — M818 Other osteoporosis without current pathological fracture: Secondary | ICD-10-CM | POA: Diagnosis not present

## 2024-07-11 DIAGNOSIS — E782 Mixed hyperlipidemia: Secondary | ICD-10-CM | POA: Diagnosis not present

## 2024-07-11 DIAGNOSIS — M818 Other osteoporosis without current pathological fracture: Secondary | ICD-10-CM | POA: Diagnosis not present
